# Patient Record
Sex: Male | Born: 1965 | Race: Black or African American | Hispanic: No | Marital: Married | Smoking: Never smoker
Health system: Southern US, Community
[De-identification: ages and names within clinical notes are randomized; demographics above are authoritative.]

## PROBLEM LIST (undated history)

## (undated) DIAGNOSIS — I10 Essential (primary) hypertension: Secondary | ICD-10-CM

## (undated) HISTORY — PX: HERNIA REPAIR: SHX51

---

## 2019-10-28 ENCOUNTER — Other Ambulatory Visit: Payer: Self-pay

## 2019-10-28 ENCOUNTER — Emergency Department (HOSPITAL_COMMUNITY): Payer: Medicaid Other

## 2019-10-28 ENCOUNTER — Encounter (HOSPITAL_COMMUNITY): Payer: Self-pay | Admitting: *Deleted

## 2019-10-28 ENCOUNTER — Emergency Department (HOSPITAL_COMMUNITY)
Admission: EM | Admit: 2019-10-28 | Discharge: 2019-10-28 | Disposition: A | Discharge status: Home / Self Care | Payer: Medicaid Other | Attending: Emergency Medicine | Admitting: Emergency Medicine

## 2019-10-28 DIAGNOSIS — J069 Acute upper respiratory infection, unspecified: Secondary | ICD-10-CM | POA: Insufficient documentation

## 2019-10-28 DIAGNOSIS — R05 Cough: Secondary | ICD-10-CM | POA: Insufficient documentation

## 2019-10-28 DIAGNOSIS — J029 Acute pharyngitis, unspecified: Secondary | ICD-10-CM | POA: Diagnosis not present

## 2019-10-28 DIAGNOSIS — Z20822 Contact with and (suspected) exposure to covid-19: Secondary | ICD-10-CM | POA: Insufficient documentation

## 2019-10-28 DIAGNOSIS — R0981 Nasal congestion: Secondary | ICD-10-CM | POA: Diagnosis present

## 2019-10-28 NOTE — ED Provider Notes (Signed)
[facility] EMERGENCY DEPARTMENT Provider Note   CSN: [id number] Arrival date & time: 10/28/19  2001     History Chief Complaint  Patient presents with  . Nasal Congestion    Luan Maberry is a 54 y.o. male.  HPI 54 year old male presents with cough and congestion.  Ongoing for about 3 days. No shortness of breath, fever.  Recently traveled down from [place] to here and wonders if it could be allergies.  Has some rhinorrhea and a little bit of a scratchy throat when he coughs.  Does not feel short of breath and has no chest pain.  History reviewed. No pertinent past medical history.  There are no problems to display for this patient.   Past Surgical History:  Procedure Laterality Date  . HERNIA REPAIR         No family history on file.  Social History   Tobacco Use  . Smoking status: Never Smoker  Substance Use Topics  . Alcohol use: Yes  . Drug use: Never    Home Medications Prior to Admission medications   Not on File    Allergies    Patient has no known allergies.  Review of Systems   Review of Systems  Constitutional: Negative for fever.  HENT: Positive for congestion, rhinorrhea and sore throat.   Respiratory: Positive for cough. Negative for shortness of breath.   Cardiovascular: Negative for chest pain.    Physical Exam Updated Vital Signs BP 131/89 (BP Location: Right Arm)   Pulse 63   Temp 98.2 F (36.8 C) (Oral)   Resp 19   Ht 6\' 1"  (1.854 m)   Wt 89.8 kg   SpO2 98%   BMI 26.12 kg/m   Physical Exam Vitals and nursing note reviewed.  Constitutional:      Appearance: He is well-developed.  HENT:     Head: Normocephalic and atraumatic.     Right Ear: External ear normal.     Left Ear: External ear normal.     Nose: Nose normal.     Mouth/Throat:     Pharynx: No oropharyngeal exudate or posterior oropharyngeal erythema.  Eyes:     General:        Right eye: No discharge.        Left eye: No discharge.    Cardiovascular:     Rate and Rhythm: Normal rate and regular rhythm.     Heart sounds: Normal heart sounds.  Pulmonary:     Effort: Pulmonary effort is normal.     Breath sounds: Rhonchi (mild, intermittent) present.  Abdominal:     General: There is no distension.  Musculoskeletal:     Cervical back: Neck supple.  Skin:    General: Skin is warm and dry.  Neurological:     Mental Status: He is alert.  Psychiatric:        Mood and Affect: Mood is not anxious.     ED Results / Procedures / Treatments   Labs (all labs ordered are listed, but only abnormal results are displayed) Labs Reviewed  SARS CORONAVIRUS 2 (TAT 6-24 HRS)    EKG None  Radiology DG Chest 2 View  Result Date: 10/28/2019 CLINICAL DATA:  Pt from [place] to visit family. C/o nasal congestion and cough for a few days. Denies covid exposure EXAM: CHEST - 2 VIEW COMPARISON:  None. FINDINGS: The heart size and mediastinal contours are within normal limits. The lungs are clear. No pneumothorax or pleural effusion. The  visualized skeletal structures are unremarkable. IMPRESSION: No active cardiopulmonary disease. Electronically Signed   By: Audie Pinto M.D.   On: 10/28/2019 21:59    Procedures Procedures (including critical care time)  Medications Ordered in ED Medications - No data to display  ED Course  I have reviewed the triage vital signs and the nursing notes.  Pertinent labs & imaging results that were available during my care of the patient were reviewed by me and considered in my medical decision making (see chart for details).    MDM Rules/Calculators/A&P                      Patient probably has a mild viral URI.  He otherwise is well-appearing.  No shortness of breath.  Will test for Covid.  Chest x-ray personally reviewed and is negative.  Advised him to take over-the-counter allergy meds which should help with supportive care.  Archimedes Harold was evaluated in Emergency Department on 10/28/2019  for the symptoms described in the history of present illness. He was evaluated in the context of the global COVID-19 pandemic, which necessitated consideration that the patient might be at risk for infection with the SARS-CoV-2 virus that causes COVID-19. Institutional protocols and algorithms that pertain to the evaluation of patients at risk for COVID-19 are in a state of rapid change based on information released by regulatory bodies including the CDC and federal and state organizations. These policies and algorithms were followed during the patient's care in the ED.  Final Clinical Impression(s) / ED Diagnoses Final diagnoses:  Viral upper respiratory tract infection with cough    Rx / DC Orders ED Discharge Orders    None       Sherwood Gambler, MD 10/28/19 2252

## 2019-10-28 NOTE — ED Notes (Signed)
Patient verbalizes understanding of discharge instructions. Opportunity for questioning and answers were provided. Armband removed by staff, pt discharged from ED stable & ambulatory  

## 2019-10-28 NOTE — Discharge Instructions (Signed)
If you develop high fever, severe cough or cough with blood, trouble breathing, severe headache, neck pain/stiffness, vomiting, or any other new/concerning symptoms then return to the ER for evaluation  

## 2019-10-28 NOTE — ED Triage Notes (Signed)
Pt from [place] to visit family. C/o nasal congestion and cough for a few days. Denies covid exposure

## 2019-10-28 NOTE — ED Notes (Signed)
Pt transported to [facility] via stretcher

## 2019-10-29 LAB — SARS CORONAVIRUS 2 (TAT 6-24 HRS): SARS Coronavirus 2: NEGATIVE

## 2020-04-08 ENCOUNTER — Ambulatory Visit (INDEPENDENT_AMBULATORY_CARE_PROVIDER_SITE_OTHER): Payer: Medicaid Other

## 2020-04-08 ENCOUNTER — Ambulatory Visit (HOSPITAL_COMMUNITY)
Admission: EM | Admit: 2020-04-08 | Discharge: 2020-04-08 | Disposition: A | Discharge status: Home / Self Care | Payer: Medicaid Other | Attending: Internal Medicine | Admitting: Internal Medicine

## 2020-04-08 ENCOUNTER — Other Ambulatory Visit: Payer: Self-pay

## 2020-04-08 ENCOUNTER — Encounter (HOSPITAL_COMMUNITY): Payer: Self-pay

## 2020-04-08 DIAGNOSIS — S8992XA Unspecified injury of left lower leg, initial encounter: Secondary | ICD-10-CM | POA: Diagnosis not present

## 2020-04-08 DIAGNOSIS — S83402A Sprain of unspecified collateral ligament of left knee, initial encounter: Secondary | ICD-10-CM

## 2020-04-08 HISTORY — DX: Essential (primary) hypertension: I10

## 2020-04-08 MED ORDER — IBUPROFEN 800 MG PO TABS
800.0000 mg | ORAL_TABLET | Freq: Three times a day (TID) | ORAL | 0 refills | Status: AC
Start: 2020-04-08 — End: ?

## 2020-04-08 MED ORDER — DICLOFENAC SODIUM 1 % EX GEL: 4.0000 g | Freq: Four times a day (QID) | CUTANEOUS | 0 refills | Status: AC
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
## 2020-04-08 NOTE — Discharge Instructions (Addendum)
Continue range of motion exercises If symptoms are persistent of worse then go to [facility] early next week to be re-evaluated.
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
## 2022-03-02 IMAGING — DX DG KNEE COMPLETE 4+V*L*
5 series · 5 of 5 positions shown · non-contrast
Comparison: None.

CLINICAL DATA: 54-year-old male with left knee pain and swelling.

EXAM:
LEFT KNEE - COMPLETE 4+ VIEW

[knee ap]
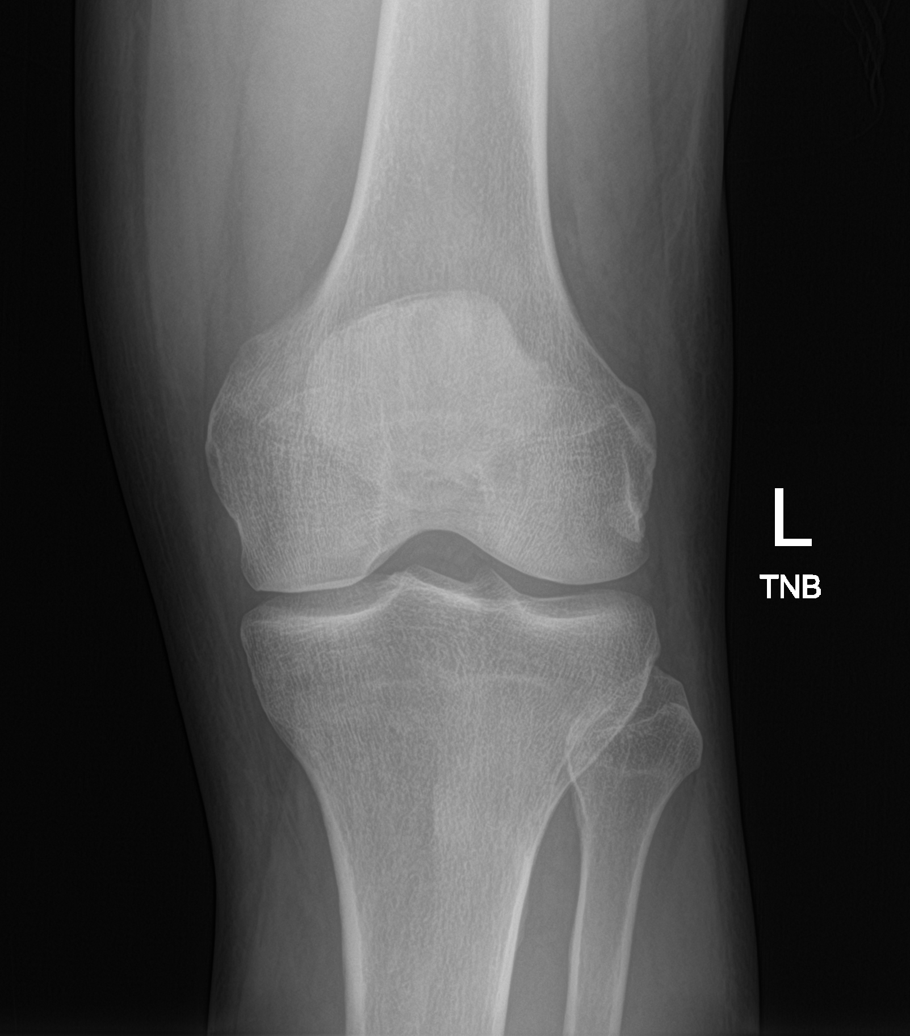

[knee obl (1 of 2)]
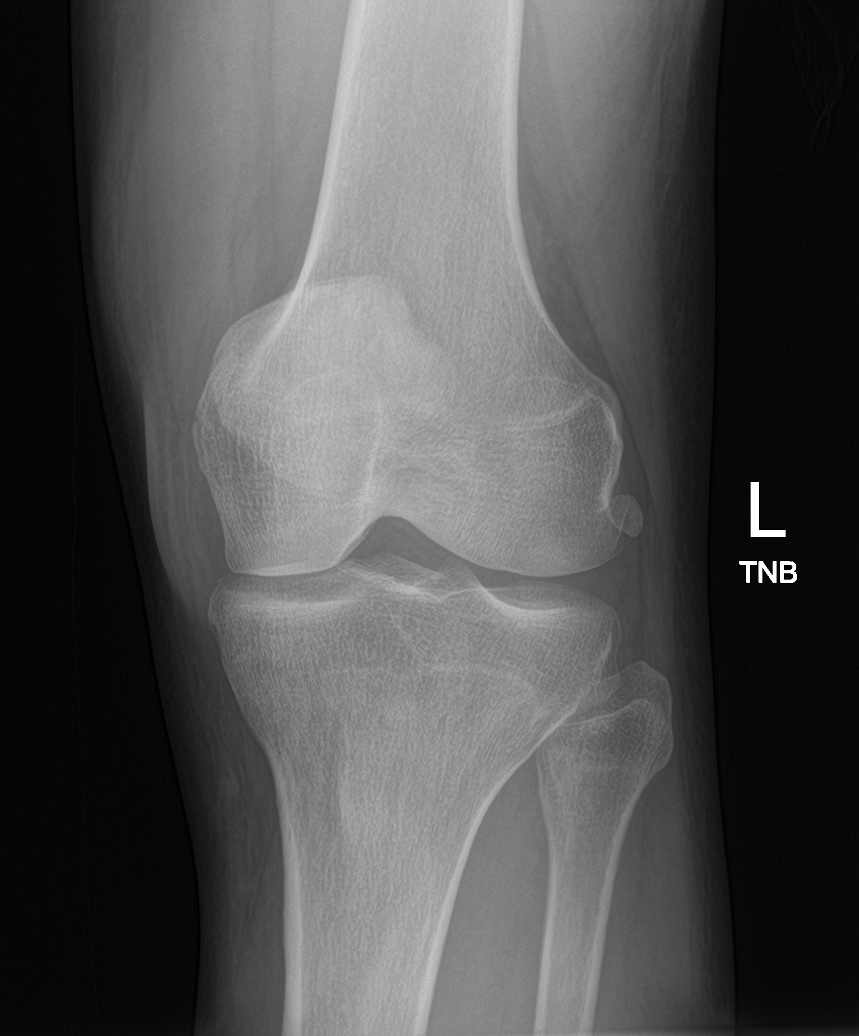

[knee obl (2 of 2)]
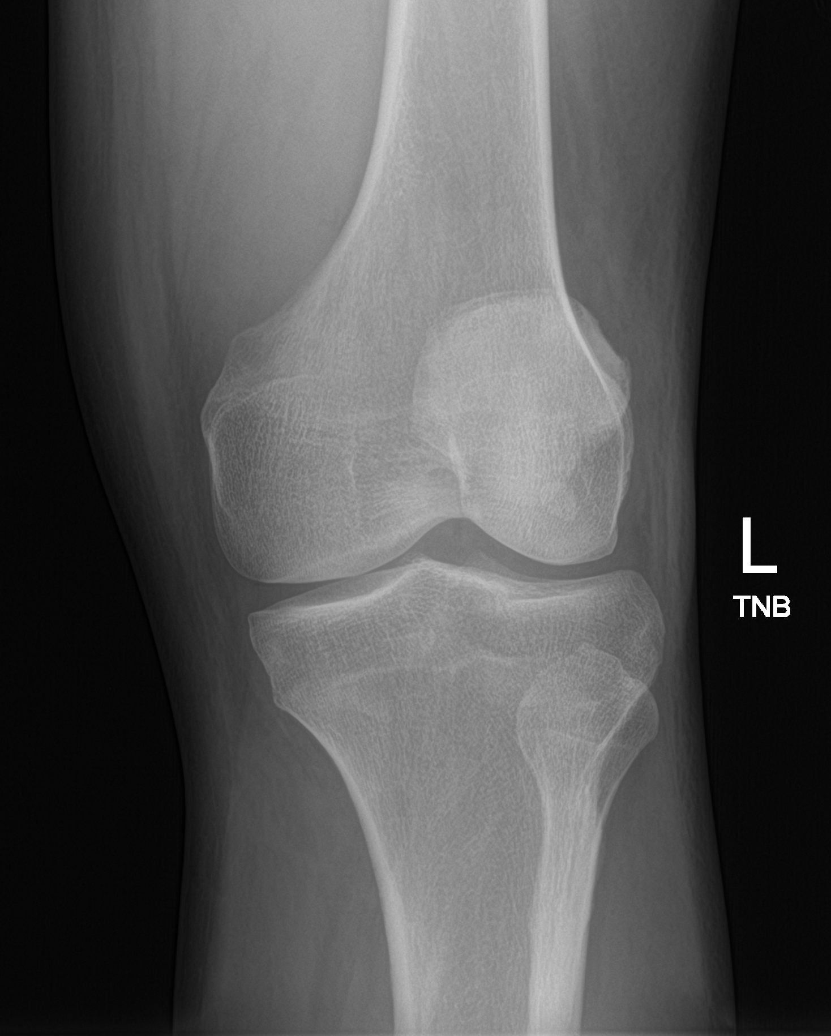

[knee lat]
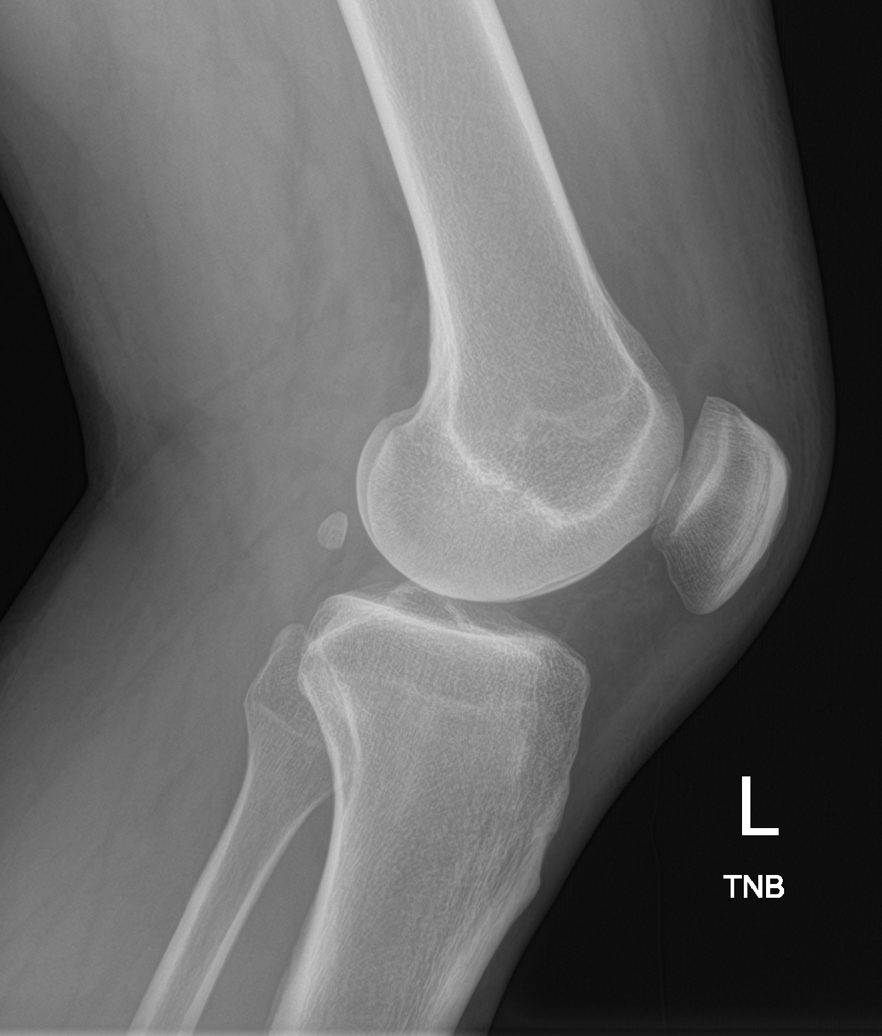

[knee sunrise]
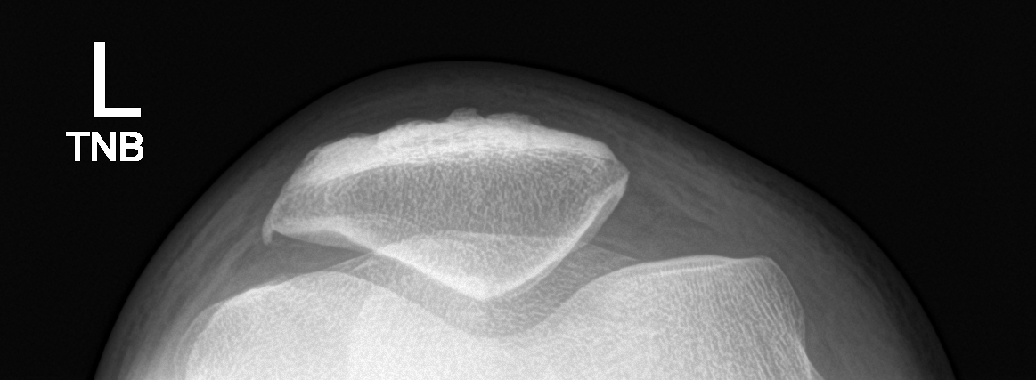

[5 of 5 positions shown; findings below may reference images not displayed]

FINDINGS: No evidence of fracture, dislocation, or joint effusion. No evidence
of arthropathy or other focal bone abnormality. Soft tissues are
unremarkable.
IMPRESSION: Negative.
